# Patient Record
Sex: Female | Born: 1985 | Race: White | Hispanic: No | Marital: Single | Smoking: Current every day smoker
Health system: Southern US, Community
[De-identification: ages and names within clinical notes are randomized; demographics above are authoritative.]

## PROBLEM LIST (undated history)

## (undated) DIAGNOSIS — R569 Unspecified convulsions: Secondary | ICD-10-CM

## (undated) DIAGNOSIS — F329 Major depressive disorder, single episode, unspecified: Secondary | ICD-10-CM

## (undated) DIAGNOSIS — W3400XA Accidental discharge from unspecified firearms or gun, initial encounter: Secondary | ICD-10-CM

## (undated) DIAGNOSIS — F4310 Post-traumatic stress disorder, unspecified: Secondary | ICD-10-CM

## (undated) DIAGNOSIS — F32A Depression, unspecified: Secondary | ICD-10-CM

## (undated) HISTORY — PX: CHOLECYSTECTOMY: SHX55

## (undated) HISTORY — PX: ABDOMINAL SURGERY: SHX537

---
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
## 2020-05-18 ENCOUNTER — Encounter (HOSPITAL_BASED_OUTPATIENT_CLINIC_OR_DEPARTMENT_OTHER): Payer: Self-pay | Admitting: Emergency Medicine

## 2020-05-18 ENCOUNTER — Other Ambulatory Visit: Payer: Self-pay

## 2020-05-18 ENCOUNTER — Emergency Department (HOSPITAL_BASED_OUTPATIENT_CLINIC_OR_DEPARTMENT_OTHER)
Admission: EM | Admit: 2020-05-18 | Discharge: 2020-05-19 | Disposition: A | Discharge status: Home / Self Care | Payer: BLUE CROSS/BLUE SHIELD | Attending: Emergency Medicine | Admitting: Emergency Medicine

## 2020-05-18 ENCOUNTER — Emergency Department (HOSPITAL_BASED_OUTPATIENT_CLINIC_OR_DEPARTMENT_OTHER): Payer: BLUE CROSS/BLUE SHIELD

## 2020-05-18 DIAGNOSIS — F17210 Nicotine dependence, cigarettes, uncomplicated: Secondary | ICD-10-CM | POA: Insufficient documentation

## 2020-05-18 DIAGNOSIS — J189 Pneumonia, unspecified organism: Secondary | ICD-10-CM | POA: Insufficient documentation

## 2020-05-18 DIAGNOSIS — Z20822 Contact with and (suspected) exposure to covid-19: Secondary | ICD-10-CM | POA: Insufficient documentation

## 2020-05-18 DIAGNOSIS — Z79899 Other long term (current) drug therapy: Secondary | ICD-10-CM | POA: Insufficient documentation

## 2020-05-18 DIAGNOSIS — R070 Pain in throat: Secondary | ICD-10-CM | POA: Diagnosis present

## 2020-05-18 DIAGNOSIS — J029 Acute pharyngitis, unspecified: Secondary | ICD-10-CM

## 2020-05-18 HISTORY — DX: Post-traumatic stress disorder, unspecified: F43.10

## 2020-05-18 HISTORY — DX: Unspecified convulsions: R56.9

## 2020-05-18 HISTORY — DX: Accidental discharge from unspecified firearms or gun, initial encounter: W34.00XA

## 2020-05-18 HISTORY — DX: Depression, unspecified: F32.A

## 2020-05-18 LAB — GROUP A STREP BY PCR: Group A Strep by PCR: NOT DETECTED

## 2020-05-18 MED ORDER — DEXAMETHASONE SODIUM PHOSPHATE 10 MG/ML IJ SOLN
10.0000 mg | Freq: Once | INTRAMUSCULAR | Status: AC
  Administered 2020-05-18: 10 mg via INTRAMUSCULAR
  Filled 2020-05-18: qty 1

## 2020-05-18 MED ORDER — DOXYCYCLINE HYCLATE 100 MG PO TABS
100.0000 mg | ORAL_TABLET | Freq: Once | ORAL | Status: AC
  Administered 2020-05-18: 100 mg via ORAL
  Filled 2020-05-18: qty 1

## 2020-05-18 MED ORDER — LIDOCAINE VISCOUS HCL 2 % MT SOLN
15.0000 mL | Freq: Once | OROMUCOSAL | Status: AC
  Administered 2020-05-18: 15 mL via OROMUCOSAL
  Filled 2020-05-18: qty 15

## 2020-05-18 MED ORDER — DOXYCYCLINE HYCLATE 100 MG PO CAPS
100.0000 mg | ORAL_CAPSULE | Freq: Two times a day (BID) | ORAL | 0 refills | Status: AC
Start: 2020-05-18 — End: ?

## 2020-05-18 MED ORDER — PREDNISONE 10 MG (21) PO TBPK
ORAL_TABLET | ORAL | 0 refills | Status: AC
Start: 2020-05-18 — End: ?

## 2020-05-18 NOTE — ED Triage Notes (Signed)
Pt reports sore throat and cough x1 week. States today she noted small amount of blood in sputum. Reports brown sputum. Denies fevers. Ibuprofen not effective.
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
## 2020-05-19 LAB — SARS CORONAVIRUS 2 (TAT 6-24 HRS): SARS Coronavirus 2: NEGATIVE

## 2021-05-07 ENCOUNTER — Emergency Department (HOSPITAL_BASED_OUTPATIENT_CLINIC_OR_DEPARTMENT_OTHER)
Admission: EM | Admit: 2021-05-07 | Discharge: 2021-05-07 | Disposition: A | Discharge status: Home / Self Care | Payer: No Typology Code available for payment source | Attending: Emergency Medicine | Admitting: Emergency Medicine

## 2021-05-07 ENCOUNTER — Encounter (HOSPITAL_BASED_OUTPATIENT_CLINIC_OR_DEPARTMENT_OTHER): Payer: Self-pay

## 2021-05-07 ENCOUNTER — Other Ambulatory Visit: Payer: Self-pay

## 2021-05-07 ENCOUNTER — Telehealth (HOSPITAL_BASED_OUTPATIENT_CLINIC_OR_DEPARTMENT_OTHER): Payer: Self-pay | Admitting: Emergency Medicine

## 2021-05-07 ENCOUNTER — Emergency Department (HOSPITAL_BASED_OUTPATIENT_CLINIC_OR_DEPARTMENT_OTHER): Payer: No Typology Code available for payment source

## 2021-05-07 ENCOUNTER — Other Ambulatory Visit (HOSPITAL_BASED_OUTPATIENT_CLINIC_OR_DEPARTMENT_OTHER): Payer: Self-pay

## 2021-05-07 DIAGNOSIS — Y990 Civilian activity done for income or pay: Secondary | ICD-10-CM | POA: Insufficient documentation

## 2021-05-07 DIAGNOSIS — S61012A Laceration without foreign body of left thumb without damage to nail, initial encounter: Secondary | ICD-10-CM | POA: Diagnosis not present

## 2021-05-07 DIAGNOSIS — F17210 Nicotine dependence, cigarettes, uncomplicated: Secondary | ICD-10-CM | POA: Insufficient documentation

## 2021-05-07 DIAGNOSIS — W260XXA Contact with knife, initial encounter: Secondary | ICD-10-CM | POA: Diagnosis not present

## 2021-05-07 DIAGNOSIS — S61122A Laceration with foreign body of left thumb with damage to nail, initial encounter: Secondary | ICD-10-CM

## 2021-05-07 DIAGNOSIS — S6992XA Unspecified injury of left wrist, hand and finger(s), initial encounter: Secondary | ICD-10-CM | POA: Diagnosis present

## 2021-05-07 MED ORDER — OXYCODONE-ACETAMINOPHEN 5-325 MG PO TABS
1.0000 | ORAL_TABLET | Freq: Once | ORAL | Status: AC
  Administered 2021-05-07: 1 via ORAL
  Filled 2021-05-07: qty 1

## 2021-05-07 MED ORDER — BOOSTRIX 5-2.5-18.5 LF-MCG/0.5 IM SUSY
0.5000 mL | PREFILLED_SYRINGE | Freq: Once | INTRAMUSCULAR | 0 refills | Status: AC
  Filled 2021-05-07: qty 0.5, 1d supply, fill #0

## 2021-05-07 MED ORDER — LIDOCAINE HCL (PF) 1 % IJ SOLN
5.0000 mL | Freq: Once | INTRAMUSCULAR | Status: AC
  Administered 2021-05-07: 5 mL via INTRADERMAL
  Filled 2021-05-07: qty 5
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
## 2021-05-07 NOTE — Discharge Instructions (Signed)
Keep the bulky dressing on for 24 hours.  Afterwards let warm soapy water run over it and keep it covered.  Return to [place] with concerning signs of infection to include red streaking up the hand pus draining from the wound or severe pain.  Take Tylenol Motrin for pain as needed.  Follow-up with the orthopedist in 1 week.  You need to call to make the appointment.

## 2021-05-07 NOTE — ED Notes (Signed)
TDAP given right deltoid per  Long MD order , information given with verbal understanding

## 2021-05-07 NOTE — ED Notes (Signed)
Pt did not want need an ice bag

## 2021-05-07 NOTE — ED Provider Notes (Signed)
Patient returns to the emergency department after consulting with her immunization history, she realized that she was not up-to-date on her tetanus shots.  She sustained a finger injury for which she was seen and treated in the emergency department earlier today.  No tetanus shot was given at that time.  We will update her tetanus here. No other complications since ED discharge.   Alona Bene, MD   Maia Plan, MD 05/07/21 [phone]

## 2021-05-07 NOTE — ED Triage Notes (Addendum)
Pt reports cut left thumb at work with a knife ~11am-partial amputation through nail noted-dsg applied-bleed through noted-pt holding direct pressure-NAD-steady gait

## 2021-10-08 IMAGING — DX DG FINGER THUMB 2+V*L*
3 series · 3 of 3 positions shown · non-contrast
Comparison: None.

CLINICAL DATA: Laceration to tip of first digit

EXAM:
LEFT THUMB 2+V

[finger ap]
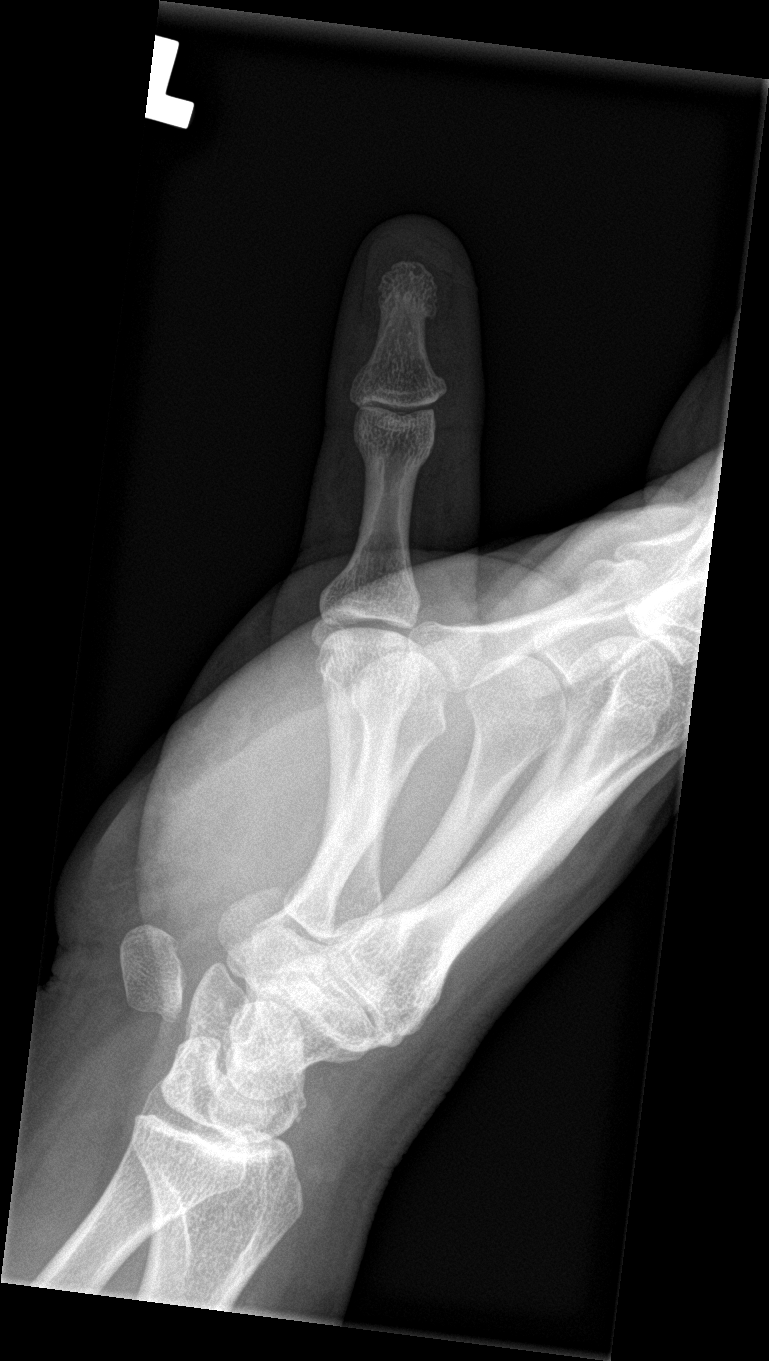

[finger obl]
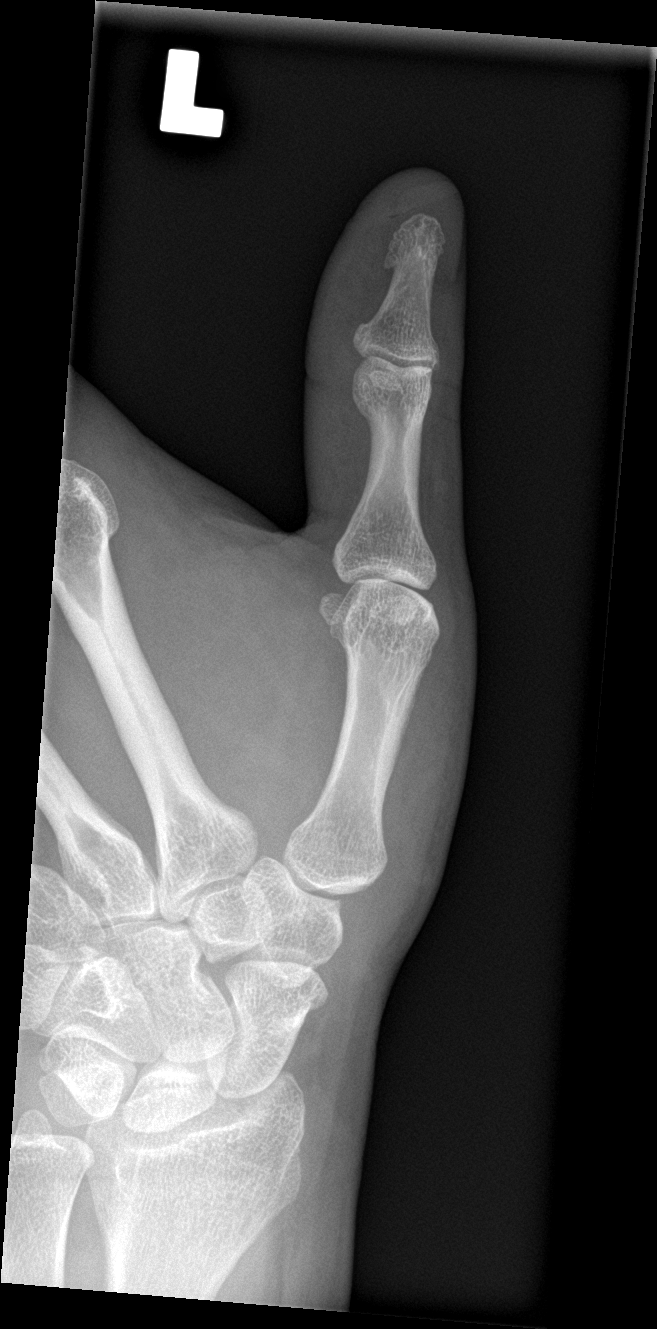

[finger lat]
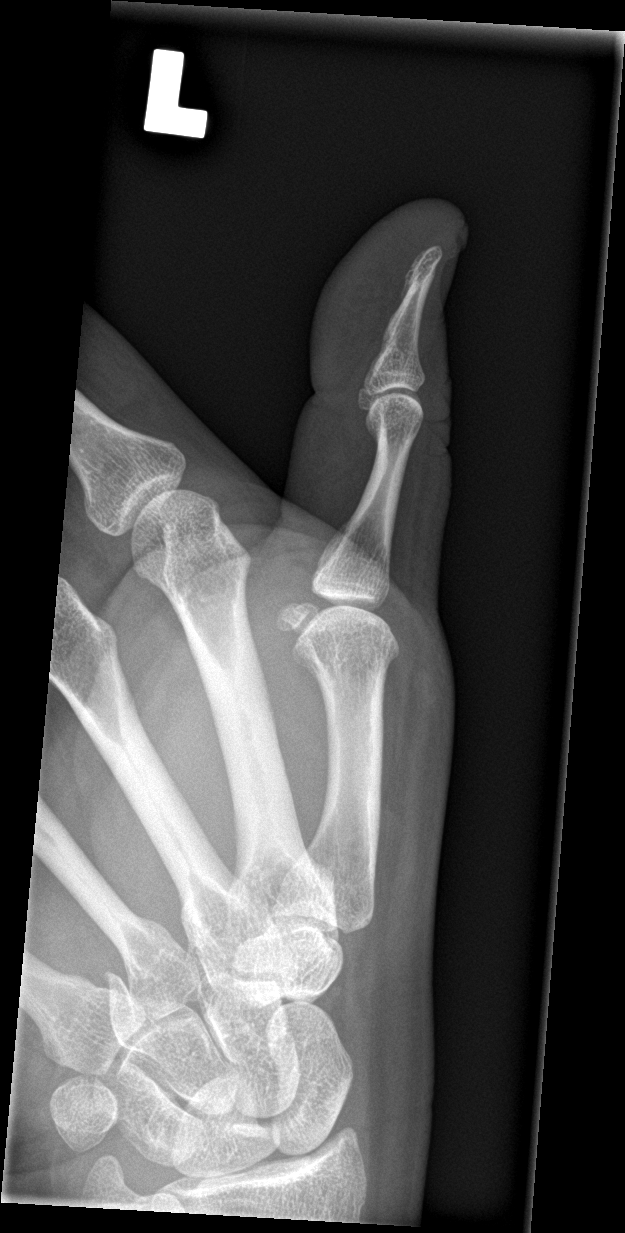

[3 of 3 positions shown; findings below may reference images not displayed]

FINDINGS: Minimal emphysema of the tip of the thumb consistent with
laceration. No fracture, dislocation, radiopaque foreign body.
IMPRESSION: No fracture, dislocation, radiopaque foreign body.

## 2022-04-28 ENCOUNTER — Emergency Department (HOSPITAL_BASED_OUTPATIENT_CLINIC_OR_DEPARTMENT_OTHER)
Admission: EM | Admit: 2022-04-28 | Discharge: 2022-04-28 | Disposition: A | Discharge status: Home / Self Care | Payer: 59 | Attending: Emergency Medicine | Admitting: Emergency Medicine

## 2022-04-28 ENCOUNTER — Other Ambulatory Visit: Payer: Self-pay

## 2022-04-28 DIAGNOSIS — B9789 Other viral agents as the cause of diseases classified elsewhere: Secondary | ICD-10-CM | POA: Insufficient documentation

## 2022-04-28 DIAGNOSIS — J028 Acute pharyngitis due to other specified organisms: Secondary | ICD-10-CM | POA: Insufficient documentation

## 2022-04-28 DIAGNOSIS — J029 Acute pharyngitis, unspecified: Secondary | ICD-10-CM

## 2022-04-28 LAB — GROUP A STREP BY PCR: Group A Strep by PCR: NOT DETECTED

## 2022-04-28 NOTE — ED Provider Notes (Signed)
?[facility] EMERGENCY DEPARTMENT ?Provider Note ? ? ?CSN: [id number] ?Arrival date & time: 04/28/22  1831 ? ?  ? ?History ? ?Chief Complaint  ?Patient presents with  ? Sore Throat  ? ? ?Robyn Owens is a 36 y.o. female who presents to the ED today with complaint of gradual onset, constant, achy, sore throat that began 5 days ago.  Patient denies any fevers or chills.  She does report that she had a left lower tooth extracted Friday and when she woke up she was having a slight sore throat.  Denies intubation.  He has been taking ibuprofen as needed with mild relief.  She states that she wanted to make sure that she did not have strep throat as she knows that it is going around.  No other complaints at this time. ? ?The history is provided by the patient and medical records.  ? ?  ? ?Home Medications ?Prior to Admission medications   ?Medication Sig Start Date End Date Taking? Authorizing Provider  ?doxycycline (VIBRAMYCIN) 100 MG capsule Take 1 capsule (100 mg total) by mouth 2 (two) times daily. 05/18/20   Jacalyn Lefevre, MD  ?ibuprofen (ADVIL) 600 MG tablet Take by mouth. 07/22/19   [provider]  ?predniSONE (STERAPRED UNI-PAK 21 TAB) 10 MG (21) TBPK tablet Take 6 tabs for 2 days, then 5 for 2 days, then 4 for 2 days, then 3 for 2 days, 2 for 2 days, then 1 for 2 days 05/18/20   Jacalyn Lefevre, MD  ?   ? ?Allergies    ?Shellfish allergy   ? ?Review of Systems   ?Review of Systems  ?Constitutional:  Negative for chills and fever.  ?HENT:  Positive for sore throat. Negative for facial swelling, trouble swallowing and voice change.   ?All other systems reviewed and are negative. ? ?Physical Exam ?Updated Vital Signs ?BP (!) 137/93 (BP Location: Right Arm)   Pulse 84   Temp 98.2 ?F (36.8 ?C) (Oral)   Resp 18   Ht 5\' 2"  (1.575 m)   Wt 90.7 kg   LMP 04/28/2022 (Exact Date)   SpO2 100%   BMI 36.58 kg/m?  ?Physical Exam ?Vitals and nursing note reviewed.  ?Constitutional:   ?   Appearance: She  is not ill-appearing.  ?HENT:  ?   Head: Normocephalic and atraumatic.  ?   Mouth/Throat:  ?   Mouth: Mucous membranes are moist.  ?   Pharynx: Uvula midline. Posterior oropharyngeal erythema present. No pharyngeal swelling, oropharyngeal exudate or uvula swelling.  ? ?   Comments: Extracted tooth without gingival erythema or edema. No abscess identified. No swelling to sublingual/submental area. No concern for ludwig's angina.  ? ?Mild posterior oropharyngeal erythema. No edema or exudate. Phonating normally. Tolerating own secretions without difficulty.  ?Eyes:  ?   Conjunctiva/sclera: Conjunctivae normal.  ?Cardiovascular:  ?   Rate and Rhythm: Normal rate and regular rhythm.  ?Pulmonary:  ?   Effort: Pulmonary effort is normal.  ?   Breath sounds: Normal breath sounds.  ?Skin: ?   General: Skin is warm and dry.  ?   Coloration: Skin is not jaundiced.  ?Neurological:  ?   Mental Status: She is alert.  ? ? ?ED Results / Procedures / Treatments   ?Labs ?(all labs ordered are listed, but only abnormal results are displayed) ?Labs Reviewed  ?GROUP A STREP BY PCR  ? ? ?EKG ?None ? ?Radiology ?No results found. ? ?Procedures ?Procedures  ? ? ?Medications  Ordered in ED ?Medications - No data to display ? ?ED Course/ Medical Decision Making/ A&P ?  ?                        ?Medical Decision Making ?36 year old female who presents to the ED today with complaint of a sore throat for the past 5 days.  On arrival to the ED vitals are stable.  She appears to be in no acute distress.  Strep test was collected prior to being seen which has returned negative at this time.  She does report that she woke up with a throat after having a tooth extracted Friday morning.  She is noted to have extracted tooth to the left lower jaw.  There are no signs of infection to this area.  There is no swelling or tenderness palpation to the sublingual/submental area.  There is no obvious facial swelling.  She is phonating normally without  difficulty.  Uvula is midline.  No concern for Ludwig's angina at this time.  Suspect viral pharyngitis today.  Have recommended ibuprofen and Tylenol as needed for pain.  She is given information for PCP follow-up.  Strict return precautions have been discussed with patient.  She is in agreement with plan and stable for discharge. ? ?Problems Addressed: ?Viral pharyngitis: acute illness or injury ? ? ? ? ? ? ? ? ? ?Final Clinical Impression(s) / ED Diagnoses ?Final diagnoses:  ?Viral pharyngitis  ? ? ?Rx / DC Orders ?ED Discharge Orders   ? ? None  ? ?  ? ? ? ?Discharge Instructions   ? ?  ?Your strep test was negative today. Your symptoms are likely viral in nature. It is recommended that you take Ibuprofen and Tylenol as needed for pain. Drink plenty of fluids to stay hydrated and rest as much as possible.  ? ?Follow up with [facility] for primary care needs.  ? ?Return to the ED IMMEDIATELY for any new/worsening symptoms including worsening sore throat, sore throat lasting longer than 10 days time, inability to swallow liquids or solids, drooling on yourself, facial swelling, muffled voice, or any other new/concerning symptoms.  ? ? ? ? ?  ?Tanda Rockers, PA-C ?04/28/22 1927 ? ?  ?Benjiman Core, MD ?04/28/22 2336 ? ?

## 2022-04-28 NOTE — Discharge Instructions (Signed)
Your strep test was negative today. Your symptoms are likely viral in nature. It is recommended that you take Ibuprofen and Tylenol as needed for pain. Drink plenty of fluids to stay hydrated and rest as much as possible.  ? ?Follow up with [facility] for primary care needs.  ? ?Return to the ED IMMEDIATELY for any new/worsening symptoms including worsening sore throat, sore throat lasting longer than 10 days time, inability to swallow liquids or solids, drooling on yourself, facial swelling, muffled voice, or any other new/concerning symptoms.  ?

## 2022-04-28 NOTE — ED Triage Notes (Signed)
Sore throat x 4 days. No other complaints ?

## 2023-08-15 ENCOUNTER — Other Ambulatory Visit: Payer: Self-pay

## 2023-08-15 ENCOUNTER — Emergency Department (HOSPITAL_BASED_OUTPATIENT_CLINIC_OR_DEPARTMENT_OTHER)
Admission: EM | Admit: 2023-08-15 | Discharge: 2023-08-15 | Disposition: A | Discharge status: Home / Self Care | Payer: Self-pay | Attending: Emergency Medicine | Admitting: Emergency Medicine

## 2023-08-15 ENCOUNTER — Encounter (HOSPITAL_BASED_OUTPATIENT_CLINIC_OR_DEPARTMENT_OTHER): Payer: Self-pay

## 2023-08-15 DIAGNOSIS — K029 Dental caries, unspecified: Secondary | ICD-10-CM | POA: Insufficient documentation

## 2023-08-15 MED ORDER — AMOXICILLIN-POT CLAVULANATE 875-125 MG PO TABS: 1.0000 | ORAL_TABLET | Freq: Two times a day (BID) | ORAL | 0 refills | Status: DC

## 2023-08-15 MED ORDER — AMOXICILLIN-POT CLAVULANATE 875-125 MG PO TABS: 1.0000 | ORAL_TABLET | Freq: Two times a day (BID) | ORAL | 0 refills | Status: AC

## 2023-08-15 MED ORDER — ACETAMINOPHEN 500 MG PO TABS
1000.0000 mg | ORAL_TABLET | Freq: Once | ORAL | Status: AC
  Administered 2023-08-15: 1000 mg via ORAL
  Filled 2023-08-15: qty 2

## 2023-08-15 NOTE — Discharge Instructions (Addendum)
It was a pleasure taking care of you today!   You will be prescribed Augmentin, take as directed and ensure to complete the entire course of the antibiotic. You may take over the counter 600 mg Ibuprofen every 6 hours and alternate with 500 mg Tylenol every 6 hours as directed for no more than 7 days.  Attached is information for the on-call Dentist, call and set up a follow up appointment regarding todays ED visit. Attached you will find a resource guide for dentists in the area, call and set up a follow up appointment. Return to the Emergency Department if you are experiencing trouble breathing, trouble swallowing, decreased fluid intake, or worsening symptoms.

## 2023-08-15 NOTE — ED Provider Notes (Signed)
[facility] Provider Note   CSN: [id number] Arrival date & time: 08/15/23  1123     History  Chief Complaint  Patient presents with   Dental Problem    Robyn Owens is a 37 y.o. female who presents to the ED with concerns for right lower dental pain. Notes that she had a tooth that broke off in the area "awhile" ago. Notes that her pain has increased recently. Tried OTC medications for her symptoms. Denies fever, drainage, trouble swallowing or breathing.   The history is provided by the patient. No language interpreter was used.       Home Medications Prior to Admission medications   Medication Sig Start Date End Date Taking? Authorizing Provider  amoxicillin-clavulanate (AUGMENTIN) 875-125 MG tablet Take 1 tablet by mouth every 12 (twelve) hours. 08/15/23  Yes Aleshia Cartelli A, PA-C  doxycycline (VIBRAMYCIN) 100 MG capsule Take 1 capsule (100 mg total) by mouth 2 (two) times daily. 05/18/20   Jacalyn Lefevre, MD  ibuprofen (ADVIL) 600 MG tablet Take by mouth. 07/22/19   [provider]  predniSONE (STERAPRED UNI-PAK 21 TAB) 10 MG (21) TBPK tablet Take 6 tabs for 2 days, then 5 for 2 days, then 4 for 2 days, then 3 for 2 days, 2 for 2 days, then 1 for 2 days 05/18/20   Jacalyn Lefevre, MD      Allergies    Shellfish allergy    Review of Systems   Review of Systems  All other systems reviewed and are negative.   Physical Exam Updated Vital Signs BP (!) 142/97   Pulse 80   Temp 98.5 F (36.9 C) (Oral)   Resp 18   Ht 5\' 2"  (1.575 m)   Wt 90 kg   SpO2 98%   BMI 36.29 kg/m  Physical Exam Vitals and nursing note reviewed.  Constitutional:      General: She is not in acute distress.    Appearance: She is not ill-appearing.  HENT:     Head: Normocephalic and atraumatic.     Right Ear: External ear normal.     Left Ear: External ear normal.     Nose: Nose normal.     Mouth/Throat:     Mouth: Mucous membranes are  moist. Mucous membranes are not dry.     Dentition: Abnormal dentition. Dental tenderness and dental caries present.     Tongue: Tongue does not deviate from midline.     Pharynx: Oropharynx is clear. Uvula midline. No oropharyngeal exudate or posterior oropharyngeal erythema.     Tonsils: No tonsillar exudate or tonsillar abscesses.     Comments: Multiple dental caries noted throughout. Tenderness to palpation to right lower gum line. No fluctuance noted. No trismus. No retropharyngeal abscess. No peritonsillar abscess noted. Uvula midline. Eyes:     Extraocular Movements: Extraocular movements intact.  Cardiovascular:     Rate and Rhythm: Normal rate and regular rhythm.     Pulses: Normal pulses.     Heart sounds: Normal heart sounds.  Pulmonary:     Effort: Pulmonary effort is normal. No respiratory distress.     Breath sounds: Normal breath sounds.  Abdominal:     General: Bowel sounds are normal. There is no distension.     Palpations: Abdomen is soft. There is no mass.     Tenderness: There is no abdominal tenderness.  Musculoskeletal:        General: Normal range of motion.  Cervical back: Neck supple.  Lymphadenopathy:     Head:     Right side of head: No submental, submandibular, tonsillar, preauricular or posterior auricular adenopathy.     Left side of head: No submental, submandibular, tonsillar, preauricular or posterior auricular adenopathy.     Cervical: No cervical adenopathy.  Skin:    General: Skin is warm and dry.     Findings: No rash.  Neurological:     Mental Status: She is alert.  Psychiatric:        Behavior: Behavior normal.     ED Results / Procedures / Treatments   Labs (all labs ordered are listed, but only abnormal results are displayed) Labs Reviewed - No data to display  EKG None  Radiology No results found.  Procedures Procedures    Medications Ordered in ED Medications  acetaminophen (TYLENOL) tablet 1,000 mg (has no  administration in time range)    ED Course/ Medical Decision Making/ A&P                                 Medical Decision Making Risk OTC drugs. Prescription drug management.   Patient presents to the ED with right lower dental pain onset 2-3 days.  Patient does not have a dentist at this time.  Vital signs pt afebrile.  On exam patient with Multiple dental caries noted throughout. Tenderness to palpation to right lower gum line. No fluctuance noted. No trismus. No retropharyngeal abscess. No peritonsillar abscess noted. Uvula midline.  Differential diagnosis includes pharyngeal abscess, dental abscess, Ludwig's angina.   Medications:  I ordered medication including tylenol for symptom management I have reviewed the patients home medicines and have made adjustments as needed    Disposition: Patient presentation suspicious for dentalgia.  No abscess requiring immediate incision and drainage.  Exam not concerning for Ludwig's angina or pharyngeal abscess. After consideration of the diagnostic results and the patients response to treatment, I feel that the patient would benefit from Discharge home. Will treat with augmentin prescription.  Provided with information for on-call Dentist and instructed to follow up. Provided resource guide for dentists in the area, patient structured to follow-up with a dentist. Supportive care measures and strict return precautions discussed with patient at bedside. Pt acknowledges and verbalizes understanding. Pt appears safe for discharge. Follow up as indicated in discharge paperwork.  This chart was dictated using voice recognition software, Dragon. Despite the best efforts of this provider to proofread and correct errors, errors may still occur which can change documentation meaning.   Final Clinical Impression(s) / ED Diagnoses Final diagnoses:  Pain due to dental caries    Rx / DC Orders ED Discharge Orders          Ordered     amoxicillin-clavulanate (AUGMENTIN) 875-125 MG tablet  Every 12 hours        08/15/23 1135              Allianna Beaubien A, PA-C 08/15/23 1139    Curatolo, Adam, DO 08/15/23 1230

## 2023-08-15 NOTE — ED Triage Notes (Signed)
She stated she has had a broken tooth on the right lower side. Now having sharp pain.
